# Patient Record
Sex: Female | Born: 1960 | Race: White | Hispanic: Yes | Smoking: Never smoker
Health system: Southern US, Community
[De-identification: ages and names within clinical notes are randomized; demographics above are authoritative.]

## PROBLEM LIST (undated history)

## (undated) DIAGNOSIS — R053 Chronic cough: Secondary | ICD-10-CM

## (undated) DIAGNOSIS — R05 Cough: Secondary | ICD-10-CM

## (undated) DIAGNOSIS — E119 Type 2 diabetes mellitus without complications: Secondary | ICD-10-CM

## (undated) DIAGNOSIS — I10 Essential (primary) hypertension: Secondary | ICD-10-CM

## (undated) HISTORY — DX: Type 2 diabetes mellitus without complications: E11.9

## (undated) HISTORY — DX: Cough: R05

## (undated) HISTORY — DX: Essential (primary) hypertension: I10

## (undated) HISTORY — DX: Chronic cough: R05.3

---

## 2013-09-07 ENCOUNTER — Other Ambulatory Visit (HOSPITAL_COMMUNITY)
Admission: RE | Admit: 2013-09-07 | Discharge: 2013-09-07 | Disposition: A | Discharge status: Home / Self Care | Payer: BC Managed Care – PPO | Source: Ambulatory Visit | Attending: Family Medicine | Admitting: Family Medicine

## 2013-09-07 DIAGNOSIS — Z01419 Encounter for gynecological examination (general) (routine) without abnormal findings: Secondary | ICD-10-CM | POA: Insufficient documentation

## 2016-09-16 ENCOUNTER — Other Ambulatory Visit: Payer: Self-pay | Admitting: Family Medicine

## 2016-09-16 ENCOUNTER — Other Ambulatory Visit (HOSPITAL_COMMUNITY)
Admission: RE | Admit: 2016-09-16 | Discharge: 2016-09-16 | Disposition: A | Discharge status: Home / Self Care | Payer: BLUE CROSS/BLUE SHIELD | Source: Ambulatory Visit | Attending: Family Medicine | Admitting: Family Medicine

## 2016-09-16 DIAGNOSIS — Z124 Encounter for screening for malignant neoplasm of cervix: Secondary | ICD-10-CM | POA: Insufficient documentation

## 2016-09-16 DIAGNOSIS — Z1151 Encounter for screening for human papillomavirus (HPV): Secondary | ICD-10-CM | POA: Insufficient documentation

## 2016-09-20 LAB — CYTOLOGY - PAP
Diagnosis: NEGATIVE
HPV (WINDOPATH): NOT DETECTED

## 2018-01-02 ENCOUNTER — Ambulatory Visit
Admission: RE | Admit: 2018-01-02 | Discharge: 2018-01-02 | Disposition: A | Discharge status: Home / Self Care | Payer: BLUE CROSS/BLUE SHIELD | Source: Ambulatory Visit | Attending: Allergy | Admitting: Allergy

## 2018-01-02 ENCOUNTER — Other Ambulatory Visit: Payer: Self-pay | Admitting: Allergy

## 2018-01-02 DIAGNOSIS — R05 Cough: Secondary | ICD-10-CM

## 2018-01-02 DIAGNOSIS — R059 Cough, unspecified: Secondary | ICD-10-CM

## 2018-06-27 ENCOUNTER — Ambulatory Visit
Admission: RE | Admit: 2018-06-27 | Discharge: 2018-06-27 | Disposition: A | Discharge status: Home / Self Care | Payer: BLUE CROSS/BLUE SHIELD | Source: Ambulatory Visit | Attending: Otolaryngology | Admitting: Otolaryngology

## 2018-06-27 ENCOUNTER — Other Ambulatory Visit: Payer: Self-pay | Admitting: Otolaryngology

## 2018-06-27 DIAGNOSIS — J320 Chronic maxillary sinusitis: Secondary | ICD-10-CM

## 2018-06-29 ENCOUNTER — Encounter: Payer: Self-pay | Admitting: Internal Medicine

## 2018-07-12 ENCOUNTER — Ambulatory Visit: Payer: BLUE CROSS/BLUE SHIELD | Admitting: Internal Medicine

## 2018-07-12 ENCOUNTER — Encounter: Payer: Self-pay | Admitting: Internal Medicine

## 2018-07-12 VITALS — BP 118/80 | HR 82 | Ht 60.0 in | Wt 155.4 lb

## 2018-07-12 DIAGNOSIS — R053 Chronic cough: Secondary | ICD-10-CM

## 2018-07-12 DIAGNOSIS — R05 Cough: Secondary | ICD-10-CM

## 2018-07-12 DIAGNOSIS — R0982 Postnasal drip: Secondary | ICD-10-CM

## 2018-07-12 MED ORDER — ESOMEPRAZOLE MAGNESIUM 40 MG PO CPDR: 40.0000 mg | DELAYED_RELEASE_CAPSULE | Freq: Two times a day (BID) | ORAL | 2 refills | Status: DC

## 2018-07-12 MED ORDER — TRAMADOL HCL 50 MG PO TABS: 50.0000 mg | ORAL_TABLET | ORAL | 0 refills | Status: AC | PRN

## 2018-07-12 NOTE — Progress Notes (Signed)
Laurie Robinson 57 y.o. 1961-03-14 [id number] Self-referred Assessment & Plan:   Encounter Diagnoses  Name Primary?  . Chronic cough Yes  . Post-nasal drip    The predominant feature of her symptoms outside of the cough are postnasal drip.  I have explained to the patient through her daughter that this is often multifactorial and trying to suppress the cough is a key component of this problem.  If she has GERD she is on inadequate treatment for atypical GERD.  She needs high dose twice daily treatment.  There are some reports that link postnasal drip to GERD but I believe it is the minority of people with postnasal drip, nevertheless postnasal drip symptoms have been successfully treated by twice daily PPI therapy in at least one trial and I am aware.  I have explained how laryngeal findings are generally nonspecific and may or may not really be related to reflux.  She has been skin tested for allergies which is the most accurate way I do not think RAST testing is necessary or indicated.  Plans as follows:  GERD sheet to explain dietary and lifestyle changes.  She has already elevated the head of her bed. Cough instructions as below:  We believe you have a chronic/cyclical cough that is aggravated by reflux , coughing , and drainage.  Goal is to not Cough or clear throat.   Avoid coughing or clearing throat by using non-mint. products/sugarless candy, water, ice chips. Remember NO MINT PRODUCTS  Mucinex DM 1-2 every 12 hrs or Delsym 2 tsp every 12 hrs f or cough   Tramadol 50 mg every 4hrs as needed for breakthrough coughing until 100% cough free  If no better 1 mo contact me for next steps, consider evaluation by other physicians to treat cough and postnasal drip (pulmonary, ENT) Bid 40 mg nexium Post nasal drainage instruction sheet given as well  I appreciate the opportunity to care for this patient. CC: Laurie Stalker, PA-C Dr. Mosetta Anis Subjective:   Chief Complaint:  Cough  HPI The patient is a 57 year old married woman originally from [place] here with her daughter who interprets, with a cough for more than a year.  She coughs up some clear type phlegm.  It bothers her throughout the day and the night and it disrupts her sleep.  There is no hoarseness of sore throat.  No breathing difficulty.  She does not recall some sort of upper respiratory illness or anything triggering this.  She complains of a chronic persistent postnasal drip symptoms.  She has been on Prilosec 10 or perhaps 20 mg though our records say 10 mg daily.  For some time now.  She has seen Dr. [name] of allergy and Dr. [name] of ENT.  Records review shows that she met Dr. [name] in February of this year she had a normal spirometry and PFT test he thought she had allergic rhinitis and allergic conjunctivitis and treated her with levo cetirizine, fluticasone spray nasal, pro-air inhaler did a chest x-ray that was negative and eyedrops for allergic conjunctivitis and put her on Prilosec 10 mg.  She followed up in June reporting that the allergic rhinitis symptoms resolved but still had postnasal drip.  Montelukast was also on board at that time.  Ipratropium nasal spray was started.  Allergy testing had been done in February and all food allergy tests were negative no significant rash with pinprick, she had a positive reaction to cockroach.  She saw Dr. [name] on August 13 he diagnosed  her with allergic rhinitis due to pollen and food and snoring with very large tonsils and mild maxillary and ethmoid allergic sinus issues with laryngitis and reflux esophagitis.  He recommended she see a GI physician and to consider Rast testing.  Sinus x-rays were negative.  He was going to switch her over to [facility] which she was on before she saw Dr. [name].  As above despite all of these treatments and evaluations she still has her problem and it is frustrating.  She might still be taking montelukast asked but  probably not as best I can tell, and all of the other medications listed above directed allergies etc. have not been filled since June.  The montelukast was filled on August 2. No Known Allergies Current Meds  Medication Sig  . atorvastatin (LIPITOR) 20 MG tablet Take 20 mg by mouth daily.  . metFORMIN (GLUCOPHAGE) 500 MG tablet Take by mouth daily.  . [DISCONTINUED] omeprazole (PRILOSEC) 10 MG capsule Take 10 mg by mouth daily.   Past Medical History:  Diagnosis Date  . Chronic cough   . Diabetes ([place])   . High blood pressure    History reviewed. No pertinent surgical history. Social History   Social History Narrative   Married she has had 4 sons and 1 daughter though one son is deceased.  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  She is from [place].   She speaks Spanish no significant [place]   Never smoker no alcohol no caffeine no substance or other tobacco.   There is no family history of GI problems or colon cancer.   Review of Systems As per HPI all other review of systems are negative.  Objective:   Physical Exam _0  118/80   Pulse 82   Ht 5' (1.524 m)   Wt 155 lb 6 oz (70.5 kg)   BMI 30.34 kg/m @  General:  Well-developed, well-nourished and in no acute distress Eyes:  anicteric. ENT:   Mouth and posterior pharynx free of lesions.  Proximal posterior pharynx looks clear to me. Neck:   supple w/o thyromegaly or mass.  Lungs: Clear to auscultation bilaterally. Heart:  S1S2, no rubs, murmurs, gallops. Abdomen:  soft, non-tender, no hepatosplenomegaly, hernia, or mass and BS+.  Lymph:  no cervical or supraclavicular adenopathy. Extremities:   no edema, cyanosis or clubbing Skin   no rash. Neuro:  A&O x 3.  Psych:  appropriate mood and  Affect.   Data Reviewed: See HPI for review of extensive testing notes etc. I have personally reviewed the images of the chest x-ray

## 2018-07-12 NOTE — Patient Instructions (Addendum)
    We believe you have a chronic/cyclical cough that is aggravated by reflux , coughing , and drainage.  Goal is to not Cough or clear throat.   Avoid coughing or clearing throat by using non-mint. products/sugarless candy, water, ice chips. Remember NO MINT PRODUCTS  Mucinex DM 1-2 every 12 hrs or Delsym 2 tsp every 12 hrs f or cough   Tramadol 50 mg every 4hrs as needed for breakthrough coughing until 100% cough free.  We are going to get your records from [facility] and also from Dr Haroldine Lawsrossley.   We have sent the following medications to your pharmacy for you to pick up at your convenience: Generic Nexium (if this is too expensive call us back), and Tramadol   CALL US IN A MONTH WITH AN UPDATE ON HOW SHE'S DOING.   Come back to see us in October 15th at 9:00AM.   I appreciate the opportunity to care for you. Stan Headarl Gessner, MD, [facility]

## 2018-07-14 ENCOUNTER — Encounter: Payer: Self-pay | Admitting: Internal Medicine

## 2018-07-14 DIAGNOSIS — R053 Chronic cough: Secondary | ICD-10-CM | POA: Insufficient documentation

## 2018-07-14 DIAGNOSIS — R0982 Postnasal drip: Secondary | ICD-10-CM | POA: Insufficient documentation

## 2018-07-14 DIAGNOSIS — R05 Cough: Secondary | ICD-10-CM | POA: Insufficient documentation

## 2018-07-26 ENCOUNTER — Telehealth: Payer: Self-pay | Admitting: Internal Medicine

## 2018-07-26 MED ORDER — TRAMADOL HCL 50 MG PO TABS: 50.0000 mg | ORAL_TABLET | Freq: Four times a day (QID) | ORAL | 0 refills | Status: DC | PRN

## 2018-07-26 NOTE — Telephone Encounter (Signed)
Let her know I refilled Tramadol # 60 this time  She should make a next avail appt w/ me please

## 2018-07-26 NOTE — Telephone Encounter (Signed)
Cough returned when she ran out of Tramadol.  Tramadol worked.  Please advise

## 2018-08-11 ENCOUNTER — Telehealth: Payer: Self-pay | Admitting: Internal Medicine

## 2018-08-11 NOTE — Telephone Encounter (Signed)
The pt has an appt on 10/15 with Dr Leone Payor and she will discuss at that time.

## 2018-08-11 NOTE — Telephone Encounter (Signed)
Pt's daughter Merideth Abbey called stating that pt keeps having persistent cough, she was told that she could be referred to a different ENT. Pls call Violeta.

## 2018-08-29 ENCOUNTER — Encounter (INDEPENDENT_AMBULATORY_CARE_PROVIDER_SITE_OTHER): Payer: Self-pay

## 2018-08-29 ENCOUNTER — Encounter: Payer: Self-pay | Admitting: Internal Medicine

## 2018-08-29 ENCOUNTER — Institutional Professional Consult (permissible substitution): Payer: BLUE CROSS/BLUE SHIELD | Admitting: Internal Medicine

## 2018-08-29 ENCOUNTER — Ambulatory Visit: Payer: BLUE CROSS/BLUE SHIELD | Admitting: Internal Medicine

## 2018-08-29 VITALS — BP 122/60 | HR 68 | Ht 60.0 in | Wt 150.0 lb

## 2018-08-29 DIAGNOSIS — R053 Chronic cough: Secondary | ICD-10-CM

## 2018-08-29 DIAGNOSIS — R05 Cough: Secondary | ICD-10-CM | POA: Diagnosis not present

## 2018-08-29 DIAGNOSIS — R0982 Postnasal drip: Secondary | ICD-10-CM

## 2018-08-29 MED ORDER — ESOMEPRAZOLE MAGNESIUM 40 MG PO CPDR: 40.0000 mg | DELAYED_RELEASE_CAPSULE | Freq: Two times a day (BID) | ORAL | 2 refills | Status: DC

## 2018-08-29 NOTE — Addendum Note (Signed)
Addended by: [place], Adar Rase E on: 08/29/2018 10:16 AM   Modules accepted: Orders

## 2018-08-29 NOTE — Progress Notes (Signed)
   Laurie Robinson 57 y.o. August 04, 1961 [id number]  Assessment & Plan:   Encounter Diagnoses  Name Primary?  . Chronic cough Yes  . Post-nasal drip     This is at least multifactorial and I am not sure how much if any GERD is related.  That said I am reluctant to change her GERD therapy right now.  We will continue twice daily as omeprazole and reflux precautions.  I have asked her to try to work on suppressing the cough with hard candies or water or ice chips and reviewed that again.  I am going to have her see Dr. Sandrea Hughs of pulmonary for additional thoughts and therapeutic options.  I can see her back as needed.  I appreciate the opportunity to care for this patient. CC: Jarrett Soho, PA-C   Subjective:   Chief Complaint: Cough and postnasal drip  HPI Patient returns after 2 months on twice daily PPI for almost 2 months, she still has postnasal drip symptoms and cough.  Tramadol helped cough but over time it seems to be losing its efficacy and she is not inclined to refill it.  She has done head of bed elevation GERD diet and again is on the twice daily Nexium but still has postnasal drip and throat clearing and cough.  When she changes her position of her head tilted back she has more drainage.  See my note of 07/12/2018 but she is been through allergy and an ENT physician without significant benefit.  I do not think she is using hard candy or other type of cough suppressants as instructed previously. No Known Allergies Current Meds  Medication Sig  . AMBULATORY NON FORMULARY MEDICATION Medication Name: Equate Maxium Relief Mucus Relief DM-As directed  . atorvastatin (LIPITOR) 20 MG tablet Take 20 mg by mouth daily.  Marland Kitchen Dextromethorphan-guaiFENesin (DELSYM COUGH/CHEST CONGEST DM PO) Take by mouth as directed.  Marland Kitchen esomeprazole (NEXIUM) 40 MG capsule Take 1 capsule (40 mg total) by mouth 2 (two) times daily before a meal. 30 mins before breakfast and supper  . metFORMIN  (GLUCOPHAGE) 500 MG tablet Take by mouth daily.  Marland Kitchen Phenylephrine-APAP-guaiFENesin (MUCINEX SINUS-MAX PO) Take by mouth as directed.   Past Medical History:  Diagnosis Date  . Chronic cough   . Diabetes (H[facility])   . High blood pressure    History reviewed. No pertinent surgical history. Social History   Social History Narrative   Married she has had 4 sons and 1 daughter though one son is deceased.  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  She is from the [place] near [place].   She speaks Spanish no significant [place]   Never smoker no alcohol no caffeine no substance or other tobacco.   Review of Systems As above  Objective:   Physical Exam BP 122/60   Pulse 68   Ht 5' (1.524 m)   Wt 150 lb (68 kg)   BMI 29.29 kg/m  No acute distress

## 2018-08-29 NOTE — Patient Instructions (Addendum)
     We believe you have a chronic/cyclical cough that is aggravated by reflux , coughing , and drainage.  Goal is to not Cough or clear throat.   Avoid coughing or clearing throat by using non-mint. products/sugarless candy, water, ice chips. Remember NO MINT PRODUCTS  Mucinex DM 1-2 every 12 hrs or Delsym 2 tsp every 12 hrs f or cough   We are referring you to Dr. Sandrea Hughs for more help with your cough.  We will let you know when that appointment is after we have scheduled it.  Esomeprazole was refilled.  I appreciate the opportunity to care for you. Iva Boop, MD, [facility]  Dr Sherene Sires appointment is set up for 09/14/18 at 9:15AM, arrive at 9:00AM. Patients daughter informed.

## 2018-09-14 ENCOUNTER — Encounter: Payer: Self-pay | Admitting: Internal Medicine

## 2018-09-14 ENCOUNTER — Ambulatory Visit: Payer: BLUE CROSS/BLUE SHIELD | Admitting: Internal Medicine

## 2018-09-14 DIAGNOSIS — R05 Cough: Secondary | ICD-10-CM | POA: Diagnosis not present

## 2018-09-14 DIAGNOSIS — R053 Chronic cough: Secondary | ICD-10-CM

## 2018-09-14 MED ORDER — BENZONATATE 200 MG PO CAPS: 200.0000 mg | ORAL_CAPSULE | Freq: Three times a day (TID) | ORAL | 1 refills | Status: AC | PRN

## 2018-09-14 NOTE — Progress Notes (Signed)
Laurie Robinson, female    DOB: [birth year],    MRN: [id number]    Brief patient profile:  79 yowf latina moved  from [place] around 1989 to [place] then to [place]  2006 to same place she currently resides with onset around fall 2017 of daily cough > Sharma eval allergy to roaches > ENT eval > neg > Gessner eval all while on ACEi surreptitiously (not listed on avs) and all eval c/w pnds ? Etiology  and referred to pulmonary clinic 09/14/2018 by Dr   Leone Payor - in meantime pt stopped acei but not clear when last dose taken      09/14/2018  Pulmonary / Initial pulmonary eval / minimal improvement off acei last filled x 90  Jul 11 2018 though does not appear on AVS from [facility] ov's during the same time frame  Chief Complaint  Patient presents with  . Pulmonary Consult    Referred by Dr. Leone Payor. Pt c/o cough x 2 yrs. She coughs up clear sputum. Cough is worse at night and in the daytime she has PND. She was taken off of lisinopril approx 1 month ago.   Dyspnea:  No trouble at all Cough: wakes her from her sleep p sev hours not responding to multiple otcs  No obvious day to day or daytime variability or assoc excess/ purulent sputum or mucus plugs or hemoptysis or cp or chest tightness, subjective wheeze or overt  hb symptoms on ppi.    Also denies any obvious fluctuation of symptoms with weather or environmental changes or other aggravating or alleviating factors except as outlined above   No unusual exposure hx or h/o childhood pna/ asthma or knowledge of premature birth.  Current Allergies, Complete Past Medical History, Past Surgical History, Family History, and Social History were reviewed in [employer] record.  ROS  The following are not active complaints unless bolded Hoarseness, sore throat, dysphagia, dental problems, itching, sneezing,  nasal congestion or discharge of excess mucus or purulent secretions, ear ache,   fever, chills, sweats, unintended wt loss or wt gain,  classically pleuritic or exertional cp,  orthopnea pnd or arm/hand swelling  or leg swelling, presyncope, palpitations, abdominal pain, anorexia, nausea, vomiting, diarrhea  or change in bowel habits or change in bladder habits, change in stools or change in urine, dysuria, hematuria,  rash, arthralgias, visual complaints, headache, numbness, weakness or ataxia or problems with walking or coordination,  change in mood or  memory.              Past Medical History:  Diagnosis Date  . Chronic cough   . Diabetes (HCC)   . High blood pressure     Outpatient Medications Prior to Visit  Medication Sig Dispense Refill  . AMBULATORY NON FORMULARY MEDICATION Medication Name: Equate Maxium Relief Mucus Relief DM-As directed    . atorvastatin (LIPITOR) 20 MG tablet Take 20 mg by mouth daily.  3  . Dextromethorphan-guaiFENesin (DELSYM COUGH/CHEST CONGEST DM PO) Take by mouth as directed.    Marland Kitchen esomeprazole (NEXIUM) 40 MG capsule Take 1 capsule (40 mg total) by mouth 2 (two) times daily before a meal. 30 mins before breakfast and supper 60 capsule 2  . metFORMIN (GLUCOPHAGE) 500 MG tablet Take by mouth daily.    Marland Kitchen Phenylephrine-APAP-guaiFENesin (MUCINEX SINUS-MAX PO) Take by mouth as directed.                    Objective:     BP  130/84 (BP Location: Left Arm, Cuff Size: Normal)   Pulse 60   Ht 4' 11.25" (1.505 m)   Wt 153 lb 9.6 oz (69.7 kg)   SpO2 97%   BMI 30.76 kg/m   SpO2: 97 % RA  amb healthy latina nad   HEENT: nl dentition, turbinates bilaterally, and oropharynx. Nl external ear canals without cough reflex   NECK :  without JVD/Nodes/TM/ nl carotid upstrokes bilaterally   LUNGS: no acc muscle use,  Nl contour chest which is clear to A and P bilaterally without cough on insp or exp maneuvers   CV:  RRR  no s3 or murmur or increase in P2, and no edema   ABD:  soft and nontender with nl inspiratory excursion in the supine position. No bruits or organomegaly  appreciated, bowel sounds nl  MS:  Nl gait/ ext warm without deformities, calf tenderness, cyanosis or clubbing No obvious joint restrictions   SKIN: warm and dry without lesions    NEURO:  alert, approp, nl sensorium with  no motor or cerebellar deficits apparent.     I personally reviewed images and agree with radiology impression as follows:   Sinus CT  8/131/9 > nl   CXR   01/02/18 no active dz     Assessment   Chronic cough Last refilled acei Jul 11 2018 x 90  -  09/14/2018  Trial of  1st gen H1 blockers per guidelines  Plus tessalon prn    The most common causes of chronic cough in immunocompetent adults include the following: upper airway cough syndrome (UACS), previously referred to as postnasal drip syndrome (PNDS), which is caused by variety of rhinosinus conditions; (2) asthma; (3) GERD; (4) chronic bronchitis from cigarette smoking or other inhaled environmental irritants; (5) nonasthmatic eosinophilic bronchitis; and (6) bronchiectasis.   These conditions, singly or in combination, have accounted for up to 94% of the causes of chronic cough in prospective studies.   Other conditions have constituted no >6% of the causes in prospective studies These have included bronchogenic carcinoma, chronic interstitial pneumonia, sarcoidosis, left ventricular failure, ACEI-induced cough, and aspiration from a condition associated with pharyngeal dysfunction.    Chronic cough is often simultaneously caused by more than one condition. A single cause has been found from 38 to 82% of the time, multiple causes from 18 to 62%. Multiply caused cough has been the result of three diseases up to 42% of the time.    Most likely this is Upper airway cough syndrome (previously labeled PNDS),  is so named because it's frequently impossible to sort out how much is  CR/sinusitis with freq throat clearing (which can be related to primary GERD)   vs  causing  secondary (" extra esophageal")  GERD from  wide swings in gastric pressure that occur with throat clearing, often  promoting self use of mint and menthol lozenges that reduce the lower esophageal sphincter tone and exacerbate the problem further in a cyclical fashion.   These are the same pts (now being labeled as having "irritable larynx syndrome" by some cough centers) who not infrequently have a history of having failed to tolerate ace inhibitors,  dry powder inhalers or biphosphonates or report having atypical/extraesophageal reflux symptoms that don't respond to standard doses of PPI  and are easily confused as having aecopd or asthma flares by even experienced allergists/ pulmonologists (myself included).    >>>> rec maintain off acei, on max rx for gerd and suppress cough with tessalon  Plus eliminate pnds with 1st gen H1 blockers per guidelines  Then  F/u here in 2 weeks with all meds in hand using a trust but verify approach to confirm accurate Medication  Reconciliation The principal here is that until we are certain that the  patients are doing what we've asked, it makes no sense to ask them to do more.     Total time devoted to counseling  > 50 % of initial 60 min office visit:  review case with pt/daughter who speaks English and discussion of options/alternatives/ personally creating written customized instructions (translated into Magazine features editor)  in presence of pt  then going over those specific  Instructions directly with the pt including how to use all of the meds but in particular covering each new medication in detail and the difference between the maintenance= "automatic" meds and the prns using an action plan format for the latter (If this problem/symptom => do that organization reading Left to right).  Please see AVS from this visit for a full list of these instructions which I personally wrote for this pt and  are unique to this visit.             Sandrea Hughs, MD 09/14/2018

## 2018-09-14 NOTE — Patient Instructions (Addendum)
Continue nexium 40 mg Take 30- 60 min before your first and last meals of the day   GERD (REFLUX)  is an extremely common cause of respiratory symptoms just like yours , many times with no obvious heartburn at all.    It can be treated with medication, but also with lifestyle changes including elevation of the head of your bed (ideally with 6 inch  bed blocks),  Smoking cessation, avoidance of late meals, excessive alcohol, and avoid fatty foods, chocolate, peppermint, colas, red wine, and acidic juices such as orange juice.  NO MINT OR MENTHOL PRODUCTS SO NO COUGH DROPS   USE SUGARLESS CANDY INSTEAD (Jolley ranchers or Stover's or Life Savers) or even ice chips will also do - the key is to swallow to prevent all throat clearing. NO OIL BASED VITAMINS - use powdered substitutes.   Call me with the number of pills still in the lisinopril bottle    For drainage / throat tickle try take CHLORPHENIRAMINE  4 mg (chlor tab - [facility])  - take one every 4 hours as needed - available over the counter- may cause drowsiness so start with just a   One or two an hour before bedtime  and see how you tolerate it before trying in daytime.   For cough > tessalon 200 mg every 6-8 hours as needed    Please schedule a follow up office visit in 2 weeks, sooner if needed  with all medications /inhalers/ solutions in hand so we can verify exactly what you are taking. This includes all medications from all doctors and over the counters       Contine nexium 40 mg Tome 30-60 min antes de su primera y ltima comida del da  La ERGE (REFLUX) es una causa extremadamente comn de sntomas respiratorios como el suyo, [place] veces sin ninguna acidez estomacal evidente.  Se puede tratar con medicamentos, pero tambin con cambios en el estilo de vida, incluida la elevacin de la cabecera de la cama (idealmente con bloques de cama de 6 pulgadas), dejar de fumar, evitar las comidas tardas, el alcohol excesivo y [employer]nic[employer] grasos, el chocolate, la [place], las bebidas gaseosas , vino tinto y jugos cidos como el jugo de [place]. NO HAY PRODUCTOS DE MENTA O MENTOL, POR LO TANTO GOTAS UTILICE CARAMELOS SIN AZCAR EN LUGAR (ganaderos Jolley o Stover's o Life Savers) o incluso los pedazos de hielo tambin lo harn: la clave es tragar para evitar que se despeje toda la garganta. VITAMINAS SIN ACEITE: use sustitutos en polvo.   Llmame con la cantidad de pldoras an en la botella de lisinopril   Para el drenaje / cosquillas en la garganta, intente tomar CLORFENIRAMINA 4 mg (etiqueta de cloro - [facility]), tome una cada 4 horas, segn sea necesario, disponible en el mostrador, puede causar somnolencia, as que comience con [employer] horas antes de acostarse y vea cmo tolera antes de intentarlo durante Medical laboratory scientific officer.   Para la tos> tessalon 200 mg cada 6-8 horas segn sea necesario   Programe una visita de seguimiento a la oficina en 2 semanas, antes si es necesario con todos los medicamentos / inhaladores / soluciones en la mano para que podamos verificar exactamente lo que est tomando. Esto incluye todos los medicamentos de todos los mdicos y de [place].  Send feedback History [employer]

## 2018-09-15 ENCOUNTER — Encounter: Payer: Self-pay | Admitting: Internal Medicine

## 2018-09-15 NOTE — Assessment & Plan Note (Signed)
Last refilled acei Jul 11 2018 x 90  -  09/14/2018  Trial of  1st gen H1 blockers per guidelines  Plus tessalon prn    The most common causes of chronic cough in immunocompetent adults include the following: upper airway cough syndrome (UACS), previously referred to as postnasal drip syndrome (PNDS), which is caused by variety of rhinosinus conditions; (2) asthma; (3) GERD; (4) chronic bronchitis from cigarette smoking or other inhaled environmental irritants; (5) nonasthmatic eosinophilic bronchitis; and (6) bronchiectasis.   These conditions, singly or in combination, have accounted for up to 94% of the causes of chronic cough in prospective studies.   Other conditions have constituted no >6% of the causes in prospective studies These have included bronchogenic carcinoma, chronic interstitial pneumonia, sarcoidosis, left ventricular failure, ACEI-induced cough, and aspiration from a condition associated with pharyngeal dysfunction.    Chronic cough is often simultaneously caused by more than one condition. A single cause has been found from 38 to 82% of the time, multiple causes from 18 to 62%. Multiply caused cough has been the result of three diseases up to 42% of the time.    Most likely this is Upper airway cough syndrome (previously labeled PNDS),  is so named because it's frequently impossible to sort out how much is  CR/sinusitis with freq throat clearing (which can be related to primary GERD)   vs  causing  secondary (" extra esophageal")  GERD from wide swings in gastric pressure that occur with throat clearing, often  promoting self use of mint and menthol lozenges that reduce the lower esophageal sphincter tone and exacerbate the problem further in a cyclical fashion.   These are the same pts (now being labeled as having "irritable larynx syndrome" by some cough centers) who not infrequently have a history of having failed to tolerate ace inhibitors,  dry powder inhalers or biphosphonates  or report having atypical/extraesophageal reflux symptoms that don't respond to standard doses of PPI  and are easily confused as having aecopd or asthma flares by even experienced allergists/ pulmonologists (myself included).    >>>> rec maintain off acei, on max rx for gerd and suppress cough with tessalon  Plus eliminate pnds with 1st gen H1 blockers per guidelines  Then  F/u here in 2 weeks with all meds in hand using a trust but verify approach to confirm accurate Medication  Reconciliation The principal here is that until we are certain that the  patients are doing what we've asked, it makes no sense to ask them to do more.     Total time devoted to counseling  > 50 % of initial 60 min office visit:  review case with pt/daughter who speaks English and discussion of options/alternatives/ personally creating written customized instructions (translated into Magazine features editor)  in presence of pt  then going over those specific  Instructions directly with the pt including how to use all of the meds but in particular covering each new medication in detail and the difference between the maintenance= "automatic" meds and the prns using an action plan format for the latter (If this problem/symptom => do that organization reading Left to right).  Please see AVS from this visit for a full list of these instructions which I personally wrote for this pt and  are unique to this visit.

## 2018-09-28 ENCOUNTER — Ambulatory Visit: Payer: BLUE CROSS/BLUE SHIELD | Admitting: Internal Medicine

## 2018-09-28 ENCOUNTER — Encounter: Payer: Self-pay | Admitting: Internal Medicine

## 2018-09-28 VITALS — BP 140/80 | HR 98 | Ht 59.25 in | Wt 153.0 lb

## 2018-09-28 DIAGNOSIS — R05 Cough: Secondary | ICD-10-CM | POA: Diagnosis not present

## 2018-09-28 DIAGNOSIS — I10 Essential (primary) hypertension: Secondary | ICD-10-CM | POA: Diagnosis not present

## 2018-09-28 DIAGNOSIS — R053 Chronic cough: Secondary | ICD-10-CM

## 2018-09-28 NOTE — Progress Notes (Signed)
Laurie Robinson, female    DOB: 1960/12/31     MRN: [id number]    Brief patient profile:  54 yowf latina moved  from [place] around 1989 to [place] then to [place]  2006 to same place she currently resides with onset around fall 2017 of daily cough > Sharma eval allergy to roaches > ENT eval > neg > Gessner eval all while on ACEi surreptitiously (not listed on avs) and all eval c/w pnds ? Etiology  and referred to pulmonary clinic 09/14/2018 by Dr   Leone Payor - in meantime pt stopped acei late Sep 2019      09/14/2018  Pulmonary / Initial pulmonary eval / minimal improvement off acei  Since late sept 2019  Chief Complaint  Patient presents with  . Pulmonary Consult    Referred by Dr. Leone Payor. Pt c/o cough x 2 yrs. She coughs up clear sputum. Cough is worse at night and in the daytime she has PND. She was taken off of lisinopril approx 1 month ago.   Dyspnea:  No trouble at all Cough: wakes her from her sleep p sev hours not responding to multiple otcs Continue nexium 40 mg Take 30- 60 min before your first and last meals of the day  GERD diet  For drainage / throat tickle try take CHLORPHENIRAMINE  4 mg (chlor tab - [facility])  - take one every 4 hours as needed - available over the counter- may cause drowsiness so start with just a   One or two an hour before bedtime  and see how you tolerate it before trying in daytime. For cough > tessalon 200 mg every 6-8 hours as needed  Please schedule a follow up office visit in 2 weeks, sooner if needed  with all medications /inhalers/ solutions in hand so we can verify exactly what you are taking. This includes all medications from all doctors and over the counters    09/28/2018  f/u ov/Wert re: uacs  Chief Complaint  Patient presents with  . Follow-up    Cough improving but not resolved completely.   Dyspnea:  never Cough:just p stirring in am does still have a little cough / non productive minimal issue but still using tessalon (rec was prn)  Sleeping:  sleeping fine maybe 10 degrees  SABA use: none  02: none    No obvious day to day or daytime variability or assoc excess/ purulent sputum or mucus plugs or hemoptysis or cp or chest tightness, subjective wheeze or overt sinus or hb symptoms.   Sleeping p h1 x 2 at hs  without nocturnal  or early am exacerbation  of respiratory  c/o's or need for noct saba. Also denies any obvious fluctuation of symptoms with weather or environmental changes or other aggravating or alleviating factors except as outlined above   No unusual exposure hx or h/o childhood pna/ asthma or knowledge of premature birth.  Current Allergies, Complete Past Medical History, Past Surgical History, Family History, and Social History were reviewed in [employer] record.  ROS  The following are not active complaints unless bolded Hoarseness, sore throat, dysphagia, dental problems, itching, sneezing,  nasal congestion or discharge of excess mucus or purulent secretions, ear ache,   fever, chills, sweats, unintended wt loss or wt gain, classically pleuritic or exertional cp,  orthopnea pnd or arm/hand swelling  or leg swelling, presyncope, palpitations, abdominal pain, anorexia, nausea, vomiting, diarrhea  or change in bowel habits or change in bladder habits, change  in stools or change in urine, dysuria, hematuria,  rash, arthralgias, visual complaints, headache, numbness, weakness or ataxia or problems with walking or coordination,  change in mood or  memory.        Current Meds  Medication Sig  . benzonatate (TESSALON) 200 MG capsule Take 1 capsule (200 mg total) by mouth 3 (three) times daily as needed for cough.  . chlorpheniramine (CHLOR-TRIMETON) 4 MG tablet Take 4 mg by mouth every 4 (four) hours as needed for allergies.  Marland Kitchen. esomeprazole (NEXIUM) 40 MG capsule Take 1 capsule (40 mg total) by mouth 2 (two) times daily before a meal. 30 mins before breakfast and supper  . metFORMIN (GLUCOPHAGE) 500 MG  tablet Take by mouth daily.                      Objective:     amb latina/ nad   Wt Readings from Last 3 Encounters:  09/28/18 153 lb (69.4 kg)  09/14/18 153 lb 9.6 oz (69.7 kg)  08/29/18 150 lb (68 kg)     Vital signs reviewed - Note on arrival 02 sats  98% on RA and bp 140/80     HEENT: nl dentition, turbinates bilaterally, and oropharynx. Nl external ear canals without cough reflex   NECK :  without JVD/Nodes/TM/ nl carotid upstrokes bilaterally   LUNGS: no acc muscle use,  Nl contour chest which is clear to A and P bilaterally without cough on insp or exp maneuvers   CV:  RRR  no s3 or murmur or increase in P2, and no edema   ABD:  soft and nontender with nl inspiratory excursion in the supine position. No bruits or organomegaly appreciated, bowel sounds nl  MS:  Nl gait/ ext warm without deformities, calf tenderness, cyanosis or clubbing No obvious joint restrictions   SKIN: warm and dry without lesions    NEURO:  alert, approp, nl sensorium with  no motor or cerebellar deficits apparent.           Assessment                9

## 2018-09-28 NOTE — Assessment & Plan Note (Addendum)
Off acei since latedSept 2019 due to cough   Although even in retrospect it may not be clear the ACEi contributed to the pt's symptoms,  Pt improved off them and adding them back at this point or in the future would risk confusion in interpretation of non-specific respiratory symptoms to which this patient is prone  ie  Better not to muddy the waters here.     Adequate control on present rx, reviewed in detail with pt > no change in rx needed  = no rx     I had an extended discussion with the patient reviewing all relevant studies completed to date and  lasting 15 to 20 minutes of a 25 minute visit    Each maintenance medication was reviewed in detail including most importantly the difference between maintenance and prns and under what circumstances the prns are to be triggered using an action plan format that is not reflected in the computer generated alphabetically organized AVS.     Please see AVS for specific instructions unique to this visit that I personally wrote and verbalized to the the pt in detail and then reviewed with pt  by my nurse highlighting any  changes in therapy recommended at today's visit to their plan of care.

## 2018-09-28 NOTE — Assessment & Plan Note (Addendum)
Last refilled acei Jul 11 2018 x 90  And took 29  Before finished  -  09/14/2018  Trial of  1st gen H1 blockers per guidelines  Plus tessalon prn > improved 09/28/2018 so rec wean off tessalon  Much better off acei typical of Upper airway cough syndrome (previously labeled PNDS),  is so named because it's frequently impossible to sort out how much is  CR/sinusitis with freq throat clearing (which can be related to primary GERD)   vs  causing  secondary (" extra esophageal")  GERD from wide swings in gastric pressure that occur with throat clearing, often  promoting self use of mint and menthol lozenges that reduce the lower esophageal sphincter tone and exacerbate the problem further in a cyclical fashion.   These are the same pts (now being labeled as having "irritable larynx syndrome" by some cough centers) who not infrequently have a history of having failed to tolerate ace inhibitors,  dry powder inhalers or biphosphonates or report having atypical/extraesophageal reflux symptoms that don't respond to standard doses of PPI  and are easily confused as having aecopd or asthma flares by even experienced allergists/ pulmonologists (myself included).    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

## 2018-09-28 NOTE — Patient Instructions (Signed)
Ok to try off tessalon pearls  - can still use if needed   1st thing in am take one chlorpheniramine (if doesn't make you too drowsy  and continue 2  at bedtime    Please schedule a follow up office visit in 4 weeks, sooner if needed  with all medications /inhalers/ solutions in hand so we can verify exactly what you are taking. This includes all medications from all doctors and over the counters

## 2018-10-27 ENCOUNTER — Ambulatory Visit: Payer: BLUE CROSS/BLUE SHIELD | Admitting: Internal Medicine

## 2018-11-01 ENCOUNTER — Encounter: Payer: Self-pay | Admitting: Internal Medicine

## 2018-11-01 ENCOUNTER — Ambulatory Visit (INDEPENDENT_AMBULATORY_CARE_PROVIDER_SITE_OTHER): Payer: BLUE CROSS/BLUE SHIELD | Admitting: Internal Medicine

## 2018-11-01 VITALS — BP 124/82 | HR 65 | Ht 59.25 in | Wt 154.2 lb

## 2018-11-01 DIAGNOSIS — R05 Cough: Secondary | ICD-10-CM | POA: Diagnosis not present

## 2018-11-01 DIAGNOSIS — R053 Chronic cough: Secondary | ICD-10-CM

## 2018-11-01 MED ORDER — ESOMEPRAZOLE MAGNESIUM 40 MG PO CPDR: 40.0000 mg | DELAYED_RELEASE_CAPSULE | Freq: Two times a day (BID) | ORAL | 2 refills | Status: AC

## 2018-11-01 NOTE — Progress Notes (Signed)
Laurie Robinson, female    DOB: 1961-07-12     MRN: [id number]    Brief patient profile:  83 yowf latina moved  from [place] around 1989 to [place] then to [place]  2006 to same place she currently resides with onset around fall 2017 of daily cough > Sharma eval allergy to roaches > ENT eval > neg > Gessner eval all while on ACEi surreptitiously (not listed on avs) and all eval c/w pnds ? Etiology  and referred to pulmonary clinic 09/14/2018 by Dr   Leone Payor - in meantime pt stopped acei late Sep 2019      09/14/2018  Pulmonary / Initial pulmonary eval / minimal improvement off acei  Since late sept 2019  Chief Complaint  Patient presents with  . Pulmonary Consult    Referred by Dr. Leone Payor. Pt c/o cough x 2 yrs. She coughs up clear sputum. Cough is worse at night and in the daytime she has PND. She was taken off of lisinopril approx 1 month ago.   Dyspnea:  No trouble at all Cough: wakes her from her sleep p sev hours not responding to multiple otcs Continue nexium 40 mg Take 30- 60 min before your first and last meals of the day  GERD diet  For drainage / throat tickle try take CHLORPHENIRAMINE  4 mg (chlor tab - [facility])  - take one every 4 hours as needed - available over the counter- may cause drowsiness so start with just a   One or two an hour before bedtime  and see how you tolerate it before trying in daytime. For cough > tessalon 200 mg every 6-8 hours as needed  Please schedule a follow up office visit in 2 weeks, sooner if needed  with all medications /inhalers/ solutions in hand so we can verify exactly what you are taking. This includes all medications from all doctors and over the counters    09/28/2018  f/u ov/Laurie Robinson re: uacs  Chief Complaint  Patient presents with  . Follow-up    Cough improving but not resolved completely.   Dyspnea:  never Cough:just p stirring in am does still have a little cough / non productive minimal issue but still using tessalon (rec was prn)  Sleeping:  sleeping fine maybe 10 degrees  rec Ok to try off tessalon pearls  - can still use if needed  1st thing in am take one chlorpheniramine (if doesn't make you too drowsy  and continue 2  at bedtime  Please schedule a follow up office visit in 4 weeks, sooner if needed  with all medications /inhalers/ solutions    11/01/2018  f/u ov/Laurie Robinson re: uacs all better  On ppi p bfast and before supper  Chief Complaint  Patient presents with  . Follow-up    cough had resolved completely and then started back a wk ago- tessalon and chlor tabs have helped.  Dyspnea:  no Cough: mostly gone, rare pnds controlled with 1st gen H1 blockers per guidelines   Sleeping: fine after h1 x 2 SABA use: none  02: none    No obvious day to day or daytime variability or assoc excess/ purulent sputum or mucus plugs or hemoptysis or cp or chest tightness, subjective wheeze or overt sinus or hb symptoms.   Sleeping fine now without nocturnal  or early am exacerbation  of respiratory  c/o's or need for noct saba. Also denies any obvious fluctuation of symptoms with weather or environmental changes or other aggravating  or alleviating factors except as outlined above   No unusual exposure hx or h/o childhood pna/ asthma or knowledge of premature birth.  Current Allergies, Complete Past Medical History, Past Surgical History, Family History, and Social History were reviewed in [facility] electronic medical record.  ROS  The following are not active complaints unless bolded Hoarseness, sore throat, dysphagia, dental problems, itching, sneezing,  nasal congestion or discharge of excess mucus or purulent secretions, ear ache,   fever, chills, sweats, unintended wt loss or wt gain, classically pleuritic or exertional cp,  orthopnea pnd or arm/hand swelling  or leg swelling, presyncope, palpitations, abdominal pain, anorexia, nausea, vomiting, diarrhea  or change in bowel habits or change in bladder habits, change in stools or  change in urine, dysuria, hematuria,  rash, arthralgias, visual complaints, headache, numbness, weakness or ataxia or problems with walking or coordination,  change in mood or  memory.        Current Meds  Medication Sig  . benzonatate (TESSALON) 200 MG capsule Take 1 capsule (200 mg total) by mouth 3 (three) times daily as needed for cough.  . chlorpheniramine (CHLOR-TRIMETON) 4 MG tablet Take 4 mg by mouth every 4 (four) hours as needed for allergies.  Marland Kitchen. esomeprazole (NEXIUM) 40 MG capsule Take 1 capsule (40 mg total) by mouth 2 (two) times daily before a meal. 30 mins before breakfast and supper  . metFORMIN (GLUCOPHAGE) 500 MG tablet Take by mouth daily.  .                              Objective:     amb latina nad    11/01/2018     154  09/28/18 153 lb (69.4 kg)  09/14/18 153 lb 9.6 oz (69.7 kg)  08/29/18 150 lb (68 kg)     Vital signs reviewed - Note on arrival 02 sats  98% on RA      HEENT: nl dentition, turbinates bilaterally, and oropharynx. Nl external ear canals without cough reflex   NECK :  without JVD/Nodes/TM/ nl carotid upstrokes bilaterally   LUNGS: no acc muscle use,  Nl contour chest which is clear to A and P bilaterally without cough on insp or exp maneuvers   CV:  RRR  no s3 or murmur or increase in P2, and no edema   ABD:  soft and nontender with nl inspiratory excursion in the supine position. No bruits or organomegaly appreciated, bowel sounds nl  MS:  Nl gait/ ext warm without deformities, calf tenderness, cyanosis or clubbing No obvious joint restrictions   SKIN: warm and dry without lesions    NEURO:  alert, approp, nl sensorium with  no motor or cerebellar deficits apparent.             Assessment                9

## 2018-11-01 NOTE — Patient Instructions (Signed)
Continue esmeprazole Take 30- 60 min before your first and last meals of the day then after holidays just take it before supper only and see if cough gets worse, and if so needs to be seen by GI doctor.  If not worse on just once daily espeprazole then 1st of February stop it and take pepcid ac 20 mg after bfast and supper for a week then after supper only x one week, then step   Pulmonary follow up is as needed

## 2018-11-02 ENCOUNTER — Encounter: Payer: Self-pay | Admitting: Internal Medicine

## 2018-11-02 NOTE — Assessment & Plan Note (Addendum)
Onset fall 2017 >> Sharma eval allergy to roaches > ENT eval > neg > Gessner eval all while on ACEi surreptitiously Last refilled acei Jul 11 2018 x 90  And took 29  Before  -  09/14/2018  Trial of  1st gen H1 blockers per guidelines  Plus tessalon prn > improved 09/28/2018 so rec wean off tessalon   Continues to demonstrate classic features of UACS marked improvement off acei  And may not need to stay on ppi longterm if can control the pnds with 1st gen H1 blockers per guidelines    Reviewed: Discussed the recent press about ppi's in the context of a statistically significant (but questionably clinically relevant) increase in CRI in pts on ppi vs h2's > bottom line is the lowest dose of ppi that controls   gerd is the right dose and if that dose is zero that's fine esp since h2's are cheaper.   If flares off ppi > gi eval next step   Pulmonary f/u can be prn    I had an extended discussion with the patient/ daughter reviewing all relevant studies completed to date and  lasting 10  minutes of a 15 minute final summary ov visit    Each maintenance medication was reviewed in detail including most importantly the difference between maintenance and prns and under what circumstances the prns are to be triggered using an action plan format that is not reflected in the computer generated alphabetically organized AVS.     Please see AVS for specific instructions unique to this visit that I personally wrote and verbalized to the the pt in detail and then reviewed with pt  by my nurse highlighting any  changes in therapy recommended at today's visit to their plan of care.

## 2019-01-24 ENCOUNTER — Telehealth: Payer: Self-pay | Admitting: Internal Medicine

## 2019-01-24 NOTE — Telephone Encounter (Signed)
Called and spoke with patients daughter, she wanted to know the name of the medication that the patient was taken off of for her blood pressure or cholesterol. Advised patients daughter that MW took patient off of Lipitor. Patients daughter verbalized understanding. Nothing further needed.

## 2019-02-16 IMAGING — CR DG SINUSES COMPLETE 3+V
3 series · 3 of 3 positions shown · non-contrast
Comparison: None.

CLINICAL DATA: Maxillary sinusitis.  Congestion.  Cough.

EXAM:
PARANASAL SINUSES - COMPLETE 3 + VIEW

[w waters]
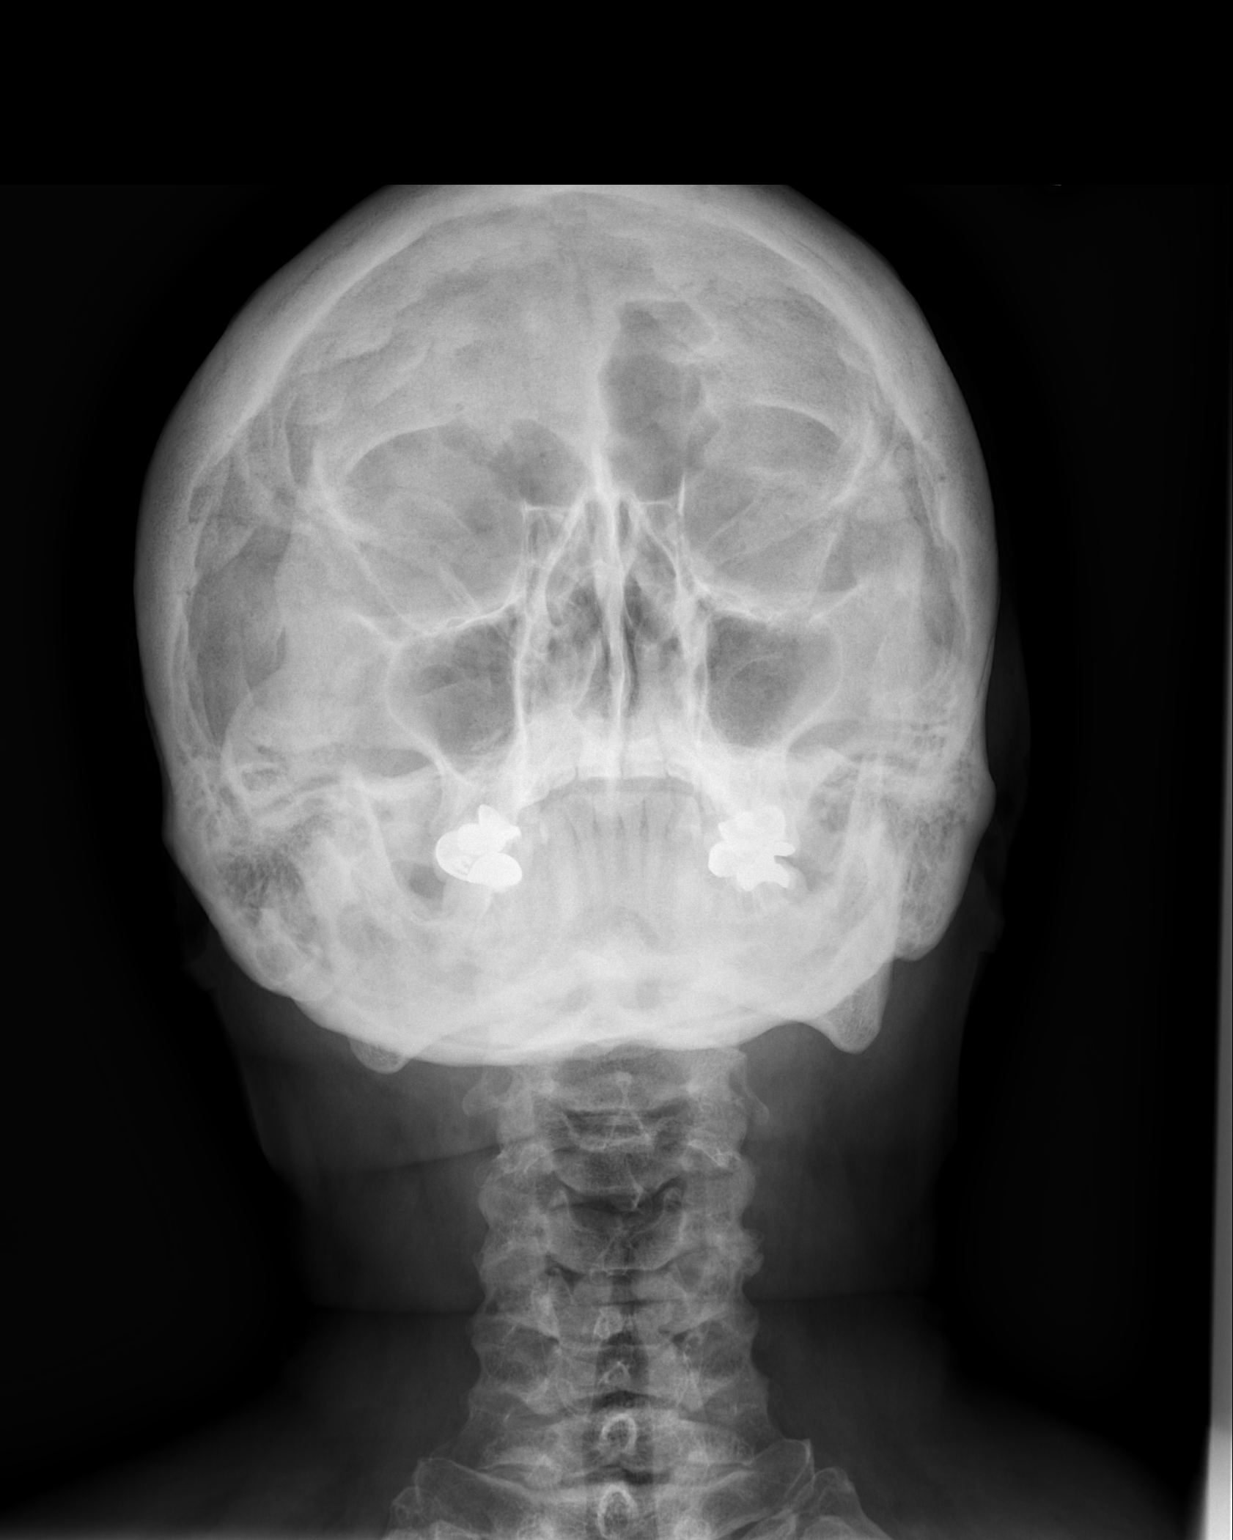

[[person_name]]
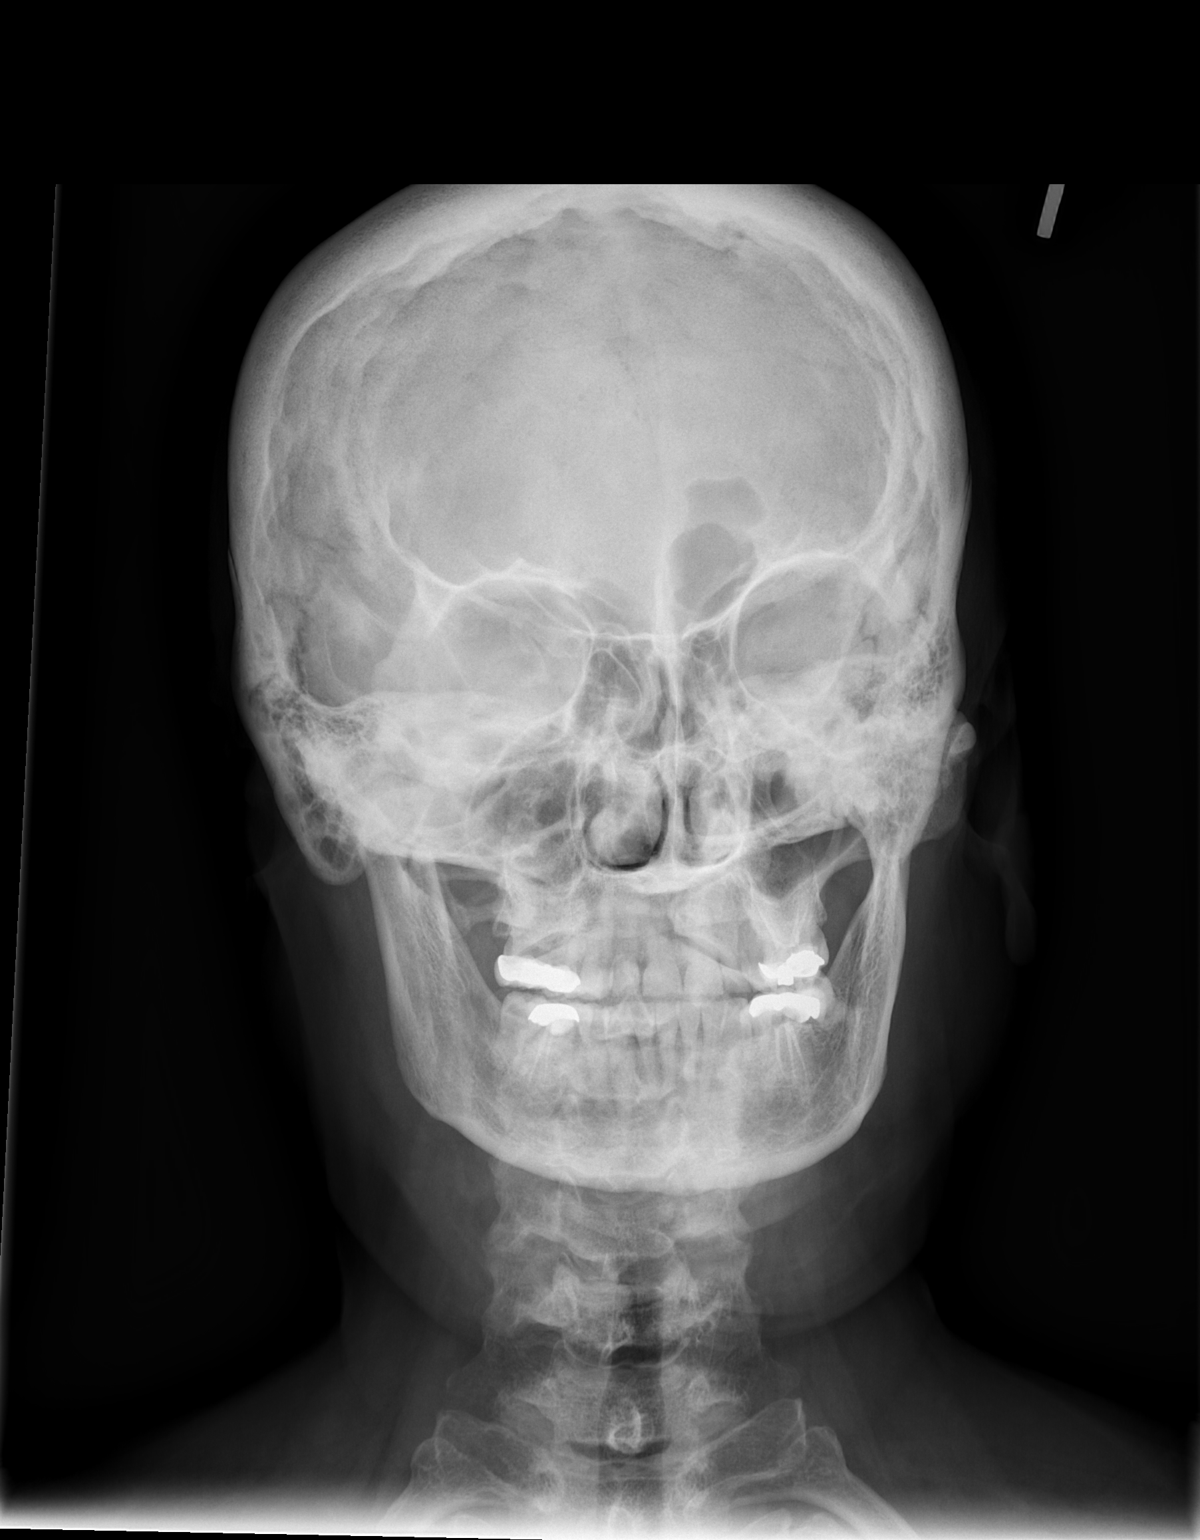

[w skull lat]
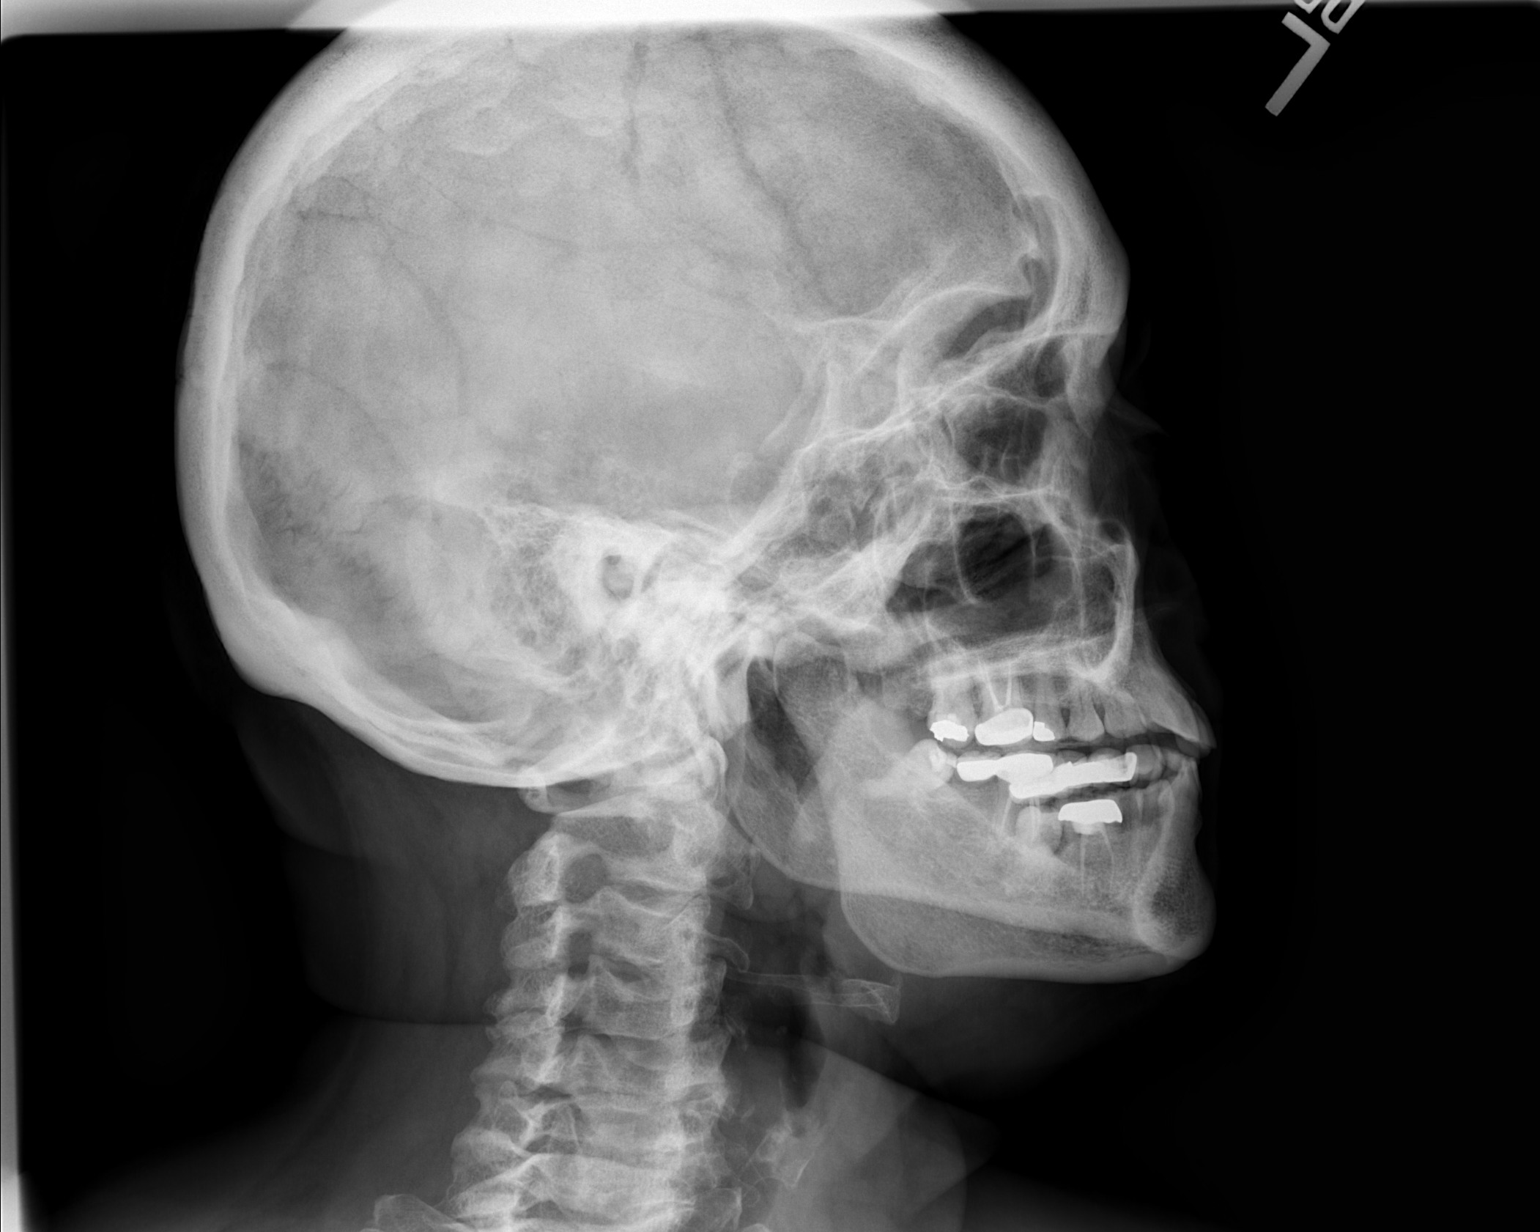

[3 of 3 positions shown; findings below may reference images not displayed]

FINDINGS: The paranasal sinus are aerated. There is no evidence of sinus
opacification air-fluid levels or mucosal thickening. No significant
bone abnormalities are seen.
IMPRESSION: Negative.

## 2020-02-16 ENCOUNTER — Ambulatory Visit: Payer: BLUE CROSS/BLUE SHIELD | Attending: Internal Medicine

## 2020-02-16 DIAGNOSIS — Z23 Encounter for immunization: Secondary | ICD-10-CM

## 2020-02-16 NOTE — Progress Notes (Signed)
   Covid-19 Vaccination Clinic  Name:  Laurie Robinson    MRN: [id number] DOB: 1961-08-31  02/16/2020  [name] was observed post Covid-19 immunization for 15 minutes without incident. She was provided with Vaccine Information Sheet and instruction to access the V-Safe system.   [name] was instructed to call 911 with any severe reactions post vaccine: Marland Kitchen Difficulty breathing  . Swelling of face and throat  . A fast heartbeat  . A bad rash all over body  . Dizziness and weakness   Immunizations Administered    Name Date Dose VIS Date Route   Pfizer COVID-19 Vaccine 02/16/2020  8:15 AM 0.3 mL 10/26/2019 Intramuscular   Manufacturer: [employer], [employer]   Lot: [id number]   NDC: [address]-8

## 2020-03-11 ENCOUNTER — Ambulatory Visit: Payer: BLUE CROSS/BLUE SHIELD | Attending: Internal Medicine

## 2020-03-11 DIAGNOSIS — Z23 Encounter for immunization: Secondary | ICD-10-CM

## 2020-03-11 NOTE — Progress Notes (Signed)
   Covid-19 Vaccination Clinic  Name:  Sameka Bagent    MRN: [id number] DOB: [birth year]  03/11/2020  Ms. Wheat was observed post Covid-19 immunization for 15 minutes without incident. She was provided with Vaccine Information Sheet and instruction to access the V-Safe system.   Ms. Massman was instructed to call 911 with any severe reactions post vaccine: Marland Kitchen Difficulty breathing  . Swelling of face and throat  . A fast heartbeat  . A bad rash all over body  . Dizziness and weakness   Immunizations Administered    Name Date Dose VIS Date Route   Pfizer COVID-19 Vaccine 03/11/2020  8:25 AM 0.3 mL 01/09/2019 Intramuscular   Manufacturer: [employer], [employer]   Lot: [id number]   NDC: [address]-4
